# Patient Record
Sex: Female | Born: 1995 | Race: Black or African American | Hispanic: No | Marital: Single | Smoking: Never smoker
Health system: Southern US, Community
[De-identification: ages and names within clinical notes are randomized; demographics above are authoritative.]

## PROBLEM LIST (undated history)

## (undated) DIAGNOSIS — N946 Dysmenorrhea, unspecified: Secondary | ICD-10-CM

## (undated) DIAGNOSIS — R102 Pelvic and perineal pain unspecified side: Secondary | ICD-10-CM

## (undated) DIAGNOSIS — Z862 Personal history of diseases of the blood and blood-forming organs and certain disorders involving the immune mechanism: Secondary | ICD-10-CM

## (undated) DIAGNOSIS — N920 Excessive and frequent menstruation with regular cycle: Secondary | ICD-10-CM

## (undated) DIAGNOSIS — D649 Anemia, unspecified: Secondary | ICD-10-CM

## (undated) DIAGNOSIS — K219 Gastro-esophageal reflux disease without esophagitis: Secondary | ICD-10-CM

## (undated) DIAGNOSIS — N926 Irregular menstruation, unspecified: Secondary | ICD-10-CM

## (undated) DIAGNOSIS — J45909 Unspecified asthma, uncomplicated: Secondary | ICD-10-CM

## (undated) HISTORY — DX: Gastro-esophageal reflux disease without esophagitis: K21.9

## (undated) HISTORY — DX: Excessive and frequent menstruation with regular cycle: N92.0

## (undated) HISTORY — DX: Dysmenorrhea, unspecified: N94.6

## (undated) HISTORY — PX: TONSILLECTOMY: SUR1361

## (undated) HISTORY — PX: POLYPECTOMY: SHX149

## (undated) HISTORY — DX: Personal history of diseases of the blood and blood-forming organs and certain disorders involving the immune mechanism: Z86.2

## (undated) HISTORY — DX: Irregular menstruation, unspecified: N92.6

## (undated) HISTORY — DX: Pelvic and perineal pain: R10.2

## (undated) HISTORY — DX: Pelvic and perineal pain unspecified side: R10.20

---
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
## 2013-07-24 ENCOUNTER — Ambulatory Visit
Admission: RE | Admit: 2013-07-24 | Discharge: 2013-07-24 | Disposition: A | Discharge status: Home / Self Care | Payer: 59 | Source: Ambulatory Visit | Attending: Pediatrics | Admitting: Pediatrics

## 2013-07-24 ENCOUNTER — Other Ambulatory Visit: Payer: Self-pay | Admitting: Pediatrics

## 2013-07-24 DIAGNOSIS — R52 Pain, unspecified: Secondary | ICD-10-CM

## 2016-06-21 ENCOUNTER — Other Ambulatory Visit: Payer: Self-pay | Admitting: Surgery

## 2016-06-21 ENCOUNTER — Ambulatory Visit
Admission: RE | Admit: 2016-06-21 | Discharge: 2016-06-21 | Disposition: A | Discharge status: Home / Self Care | Payer: BLUE CROSS/BLUE SHIELD | Source: Ambulatory Visit | Attending: Surgery | Admitting: Surgery

## 2016-06-21 DIAGNOSIS — R590 Localized enlarged lymph nodes: Secondary | ICD-10-CM

## 2016-06-22 ENCOUNTER — Other Ambulatory Visit: Payer: Self-pay | Admitting: Surgery

## 2016-06-22 DIAGNOSIS — R1909 Other intra-abdominal and pelvic swelling, mass and lump: Secondary | ICD-10-CM | POA: Insufficient documentation

## 2016-06-22 NOTE — Progress Notes (Signed)
Quick Note:  I talked to the patient's father. I have also ordered a CT scan to evaluate this area more closely. ______

## 2016-06-24 ENCOUNTER — Other Ambulatory Visit: Payer: Self-pay | Admitting: Surgery

## 2016-06-24 DIAGNOSIS — R590 Localized enlarged lymph nodes: Secondary | ICD-10-CM

## 2016-06-25 ENCOUNTER — Ambulatory Visit
Admission: RE | Admit: 2016-06-25 | Discharge: 2016-06-25 | Disposition: A | Discharge status: Home / Self Care | Payer: BLUE CROSS/BLUE SHIELD | Source: Ambulatory Visit | Attending: Surgery | Admitting: Surgery

## 2016-06-25 DIAGNOSIS — R1909 Other intra-abdominal and pelvic swelling, mass and lump: Secondary | ICD-10-CM

## 2016-06-25 MED ORDER — IOPAMIDOL (ISOVUE-300) INJECTION 61%
100.0000 mL | Freq: Once | INTRAVENOUS | Status: AC | PRN
  Administered 2016-06-25: 100 mL via INTRAVENOUS

## 2016-06-28 ENCOUNTER — Ambulatory Visit: Payer: Self-pay | Admitting: Surgery

## 2016-07-05 ENCOUNTER — Encounter (HOSPITAL_COMMUNITY): Payer: Self-pay

## 2016-07-05 ENCOUNTER — Encounter (HOSPITAL_COMMUNITY)
Admission: RE | Admit: 2016-07-05 | Discharge: 2016-07-05 | Disposition: A | Discharge status: Home / Self Care | Payer: BLUE CROSS/BLUE SHIELD | Source: Ambulatory Visit | Attending: Surgery | Admitting: Surgery

## 2016-07-05 DIAGNOSIS — K219 Gastro-esophageal reflux disease without esophagitis: Secondary | ICD-10-CM | POA: Diagnosis not present

## 2016-07-05 DIAGNOSIS — K4190 Unilateral femoral hernia, without obstruction or gangrene, not specified as recurrent: Secondary | ICD-10-CM | POA: Diagnosis not present

## 2016-07-05 DIAGNOSIS — J45909 Unspecified asthma, uncomplicated: Secondary | ICD-10-CM | POA: Diagnosis not present

## 2016-07-05 HISTORY — DX: Unspecified asthma, uncomplicated: J45.909

## 2016-07-05 HISTORY — DX: Anemia, unspecified: D64.9

## 2016-07-05 LAB — CBC
HEMATOCRIT: 39 % (ref 36.0–46.0)
HEMOGLOBIN: 12.4 g/dL (ref 12.0–15.0)
MCH: 26 pg (ref 26.0–34.0)
MCHC: 31.8 g/dL (ref 30.0–36.0)
MCV: 81.8 fL (ref 78.0–100.0)
Platelets: 273 10*3/uL (ref 150–400)
RBC: 4.77 MIL/uL (ref 3.87–5.11)
RDW: 13.7 % (ref 11.5–15.5)
WBC: 5.5 10*3/uL (ref 4.0–10.5)

## 2016-07-05 LAB — HCG, SERUM, QUALITATIVE: Preg, Serum: NEGATIVE

## 2016-07-05 NOTE — Pre-Procedure Instructions (Addendum)
Tara Hale  07/05/2016      [facility] - [place], [place] - [address] [place] [address] Phone: [phone] Fax: [phone]  [id number] [place], [place] - [place] [address] [place] [address] Phone: [phone] Fax: [phone]    Your procedure is scheduled on 07/07/16.  Report to [facility] Admitting at 630 A.M.  Call this number if you have problems the morning of surgery:  [phone]   Remember:  Do not eat food or drink liquids after midnight.  Take these medicines the morning of surgery with A SIP OF WATER  STOP all herbel meds, nsaids (aleve,naproxen,advil,ibuprofen) 5 days prior to surgery including all vitamins, aspirin   Do not wear jewelry, make-up or nail polish.  Do not wear lotions, powders, or perfumes.  You may wear deoderant.  Do not shave 48 hours prior to surgery.  Men may shave face and neck.  Do not bring valuables to the hospital.  [facility] is not responsible for any belongings or valuables.  Contacts, dentures or bridgework may not be worn into surgery.  Leave your suitcase in the car.  After surgery it may be brought to your room.  For patients admitted to the hospital, discharge time will be determined by your treatment team.  Patients discharged the day of surgery will not be allowed to drive home.   Name and phone number of your driver:   Special instructions:   Special Instructions: [facility] - Preparing for Surgery  Before surgery, you can play an important role.  Because skin is not sterile, your skin needs to be as free of germs as possible.  You can reduce the number of germs on you skin by washing with CHG (chlorahexidine gluconate) soap before surgery.  CHG is an antiseptic cleaner which kills germs and bonds with the skin to continue killing germs even after washing.  Please DO NOT use if you have an allergy to CHG or antibacterial  soaps.  If your skin becomes reddened/irritated stop using the CHG and inform your nurse when you arrive at Short Stay.  Do not shave (including legs and underarms) for at least 48 hours prior to the first CHG shower.  You may shave your face.  Please follow these instructions carefully:   1.  Shower with CHG Soap the night before surgery and the morning of Surgery.  2.  If you choose to wash your hair, wash your hair first as usual with your normal shampoo.  3.  After you shampoo, rinse your hair and body thoroughly to remove the Shampoo.  4.  Use CHG as you would any other liquid soap.  You can apply chg directly  to the skin and wash gently with scrungie or a clean washcloth.  5.  Apply the CHG Soap to your body ONLY FROM THE NECK DOWN.  Do not use on open wounds or open sores.  Avoid contact with your eyes ears, mouth and genitals (private parts).  Wash genitals (private parts)       with your normal soap.  6.  Wash thoroughly, paying special attention to the area where your surgery will be performed.  7.  Thoroughly rinse your body with warm water from the neck down.  8.  DO NOT shower/wash with your normal soap after using and rinsing off the CHG Soap.  9.  Pat yourself dry with a clean  towel.            10.  Wear clean pajamas.            11.  Place clean sheets on your bed the night of your first shower and do not sleep with pets.  Day of Surgery  Do not apply any lotions/deodorants the morning of surgery.  Please wear clean clothes to the hospital/surgery center.

## 2016-07-06 MED ORDER — CEFAZOLIN SODIUM-DEXTROSE 2-4 GM/100ML-% IV SOLN
2.0000 g | INTRAVENOUS | Status: AC
  Administered 2016-07-07: 2 g via INTRAVENOUS
  Filled 2016-07-06: qty 100

## 2016-07-07 ENCOUNTER — Ambulatory Visit (HOSPITAL_COMMUNITY): Payer: BLUE CROSS/BLUE SHIELD | Admitting: Certified Registered"

## 2016-07-07 ENCOUNTER — Encounter (HOSPITAL_COMMUNITY)
Admission: RE | Disposition: A | Discharge status: Home / Self Care | Payer: Self-pay | Source: Ambulatory Visit | Attending: Surgery

## 2016-07-07 ENCOUNTER — Ambulatory Visit (HOSPITAL_COMMUNITY)
Admission: RE | Admit: 2016-07-07 | Discharge: 2016-07-07 | Disposition: A | Discharge status: Home / Self Care | Payer: BLUE CROSS/BLUE SHIELD | Source: Ambulatory Visit | Attending: Surgery | Admitting: Surgery

## 2016-07-07 ENCOUNTER — Encounter (HOSPITAL_COMMUNITY): Payer: Self-pay | Admitting: Certified Registered"

## 2016-07-07 DIAGNOSIS — K4190 Unilateral femoral hernia, without obstruction or gangrene, not specified as recurrent: Secondary | ICD-10-CM | POA: Insufficient documentation

## 2016-07-07 DIAGNOSIS — K219 Gastro-esophageal reflux disease without esophagitis: Secondary | ICD-10-CM | POA: Insufficient documentation

## 2016-07-07 DIAGNOSIS — J45909 Unspecified asthma, uncomplicated: Secondary | ICD-10-CM | POA: Insufficient documentation

## 2016-07-07 HISTORY — PX: FEMORAL HERNIA REPAIR: SHX632

## 2016-07-07 HISTORY — PX: INSERTION OF MESH: SHX5868

## 2016-07-07 SURGERY — HERNIA REPAIR FEMORAL
Anesthesia: General | Laterality: Left

## 2016-07-07 MED ORDER — LACTATED RINGERS IV SOLN
INTRAVENOUS | Status: DC | PRN
  Administered 2016-07-07: 08:00:00 via INTRAVENOUS

## 2016-07-07 MED ORDER — MORPHINE SULFATE (PF) 2 MG/ML IV SOLN: 2.0000 mg | INTRAVENOUS | Status: DC | PRN

## 2016-07-07 MED ORDER — FENTANYL CITRATE (PF) 250 MCG/5ML IJ SOLN
INTRAMUSCULAR | Status: AC
  Filled 2016-07-07: qty 5

## 2016-07-07 MED ORDER — GLYCOPYRROLATE 0.2 MG/ML IJ SOLN
INTRAMUSCULAR | Status: DC | PRN
  Administered 2016-07-07: 0.3 mg via INTRAVENOUS

## 2016-07-07 MED ORDER — ESMOLOL HCL 100 MG/10ML IV SOLN
INTRAVENOUS | Status: AC
  Filled 2016-07-07: qty 10

## 2016-07-07 MED ORDER — NEOSTIGMINE METHYLSULFATE 10 MG/10ML IV SOLN
INTRAVENOUS | Status: DC | PRN
  Administered 2016-07-07: 3 mg via INTRAVENOUS

## 2016-07-07 MED ORDER — ONDANSETRON HCL 4 MG/2ML IJ SOLN: 4.0000 mg | INTRAMUSCULAR | Status: DC | PRN

## 2016-07-07 MED ORDER — HYDROCODONE-ACETAMINOPHEN 5-325 MG PO TABS: 1.0000 | ORAL_TABLET | ORAL | 0 refills | Status: AC | PRN

## 2016-07-07 MED ORDER — PROPOFOL 10 MG/ML IV BOLUS
INTRAVENOUS | Status: AC
  Filled 2016-07-07: qty 20

## 2016-07-07 MED ORDER — BUPIVACAINE-EPINEPHRINE (PF) 0.25% -1:200000 IJ SOLN
INTRAMUSCULAR | Status: AC
  Filled 2016-07-07: qty 30

## 2016-07-07 MED ORDER — DEXAMETHASONE SODIUM PHOSPHATE 10 MG/ML IJ SOLN
INTRAMUSCULAR | Status: AC
  Filled 2016-07-07: qty 1

## 2016-07-07 MED ORDER — MIDAZOLAM HCL 2 MG/2ML IJ SOLN
INTRAMUSCULAR | Status: AC
  Filled 2016-07-07: qty 2

## 2016-07-07 MED ORDER — ESMOLOL HCL 100 MG/10ML IV SOLN
INTRAVENOUS | Status: DC | PRN
  Administered 2016-07-07: 10 mg via INTRAVENOUS

## 2016-07-07 MED ORDER — LIDOCAINE HCL (CARDIAC) 20 MG/ML IV SOLN
INTRAVENOUS | Status: DC | PRN
  Administered 2016-07-07: 80 mg via INTRAVENOUS

## 2016-07-07 MED ORDER — ROCURONIUM BROMIDE 100 MG/10ML IV SOLN
INTRAVENOUS | Status: DC | PRN
  Administered 2016-07-07: 35 mg via INTRAVENOUS

## 2016-07-07 MED ORDER — DIPHENHYDRAMINE HCL 50 MG/ML IJ SOLN
INTRAMUSCULAR | Status: DC | PRN
  Administered 2016-07-07: 12.5 mg via INTRAVENOUS

## 2016-07-07 MED ORDER — BUPIVACAINE-EPINEPHRINE 0.25% -1:200000 IJ SOLN
INTRAMUSCULAR | Status: DC | PRN
  Administered 2016-07-07: 10 mL

## 2016-07-07 MED ORDER — NEOSTIGMINE METHYLSULFATE 5 MG/5ML IV SOSY
PREFILLED_SYRINGE | INTRAVENOUS | Status: AC
  Filled 2016-07-07: qty 5

## 2016-07-07 MED ORDER — MIDAZOLAM HCL 5 MG/5ML IJ SOLN
INTRAMUSCULAR | Status: DC | PRN
  Administered 2016-07-07: 2 mg via INTRAVENOUS

## 2016-07-07 MED ORDER — 0.9 % SODIUM CHLORIDE (POUR BTL) OPTIME
TOPICAL | Status: DC | PRN
  Administered 2016-07-07: 1000 mL

## 2016-07-07 MED ORDER — FENTANYL CITRATE (PF) 250 MCG/5ML IJ SOLN
INTRAMUSCULAR | Status: DC | PRN
  Administered 2016-07-07: 100 ug via INTRAVENOUS

## 2016-07-07 MED ORDER — DIPHENHYDRAMINE HCL 50 MG/ML IJ SOLN
INTRAMUSCULAR | Status: AC
  Filled 2016-07-07: qty 1

## 2016-07-07 MED ORDER — CHLORHEXIDINE GLUCONATE CLOTH 2 % EX PADS: 6.0000 | MEDICATED_PAD | Freq: Once | CUTANEOUS | Status: DC

## 2016-07-07 MED ORDER — DEXAMETHASONE SODIUM PHOSPHATE 10 MG/ML IJ SOLN
INTRAMUSCULAR | Status: DC | PRN
  Administered 2016-07-07: 10 mg via INTRAVENOUS

## 2016-07-07 MED ORDER — CHLORHEXIDINE GLUCONATE CLOTH 2 % EX PADS
6.0000 | MEDICATED_PAD | Freq: Once | CUTANEOUS | Status: DC
Start: 2016-07-07 — End: 2016-07-07

## 2016-07-07 MED ORDER — PROPOFOL 10 MG/ML IV BOLUS
INTRAVENOUS | Status: DC | PRN
  Administered 2016-07-07: 140 mg via INTRAVENOUS
  Administered 2016-07-07: 20 mg via INTRAVENOUS

## 2016-07-07 MED ORDER — HYDROCODONE-ACETAMINOPHEN 5-325 MG PO TABS
1.0000 | ORAL_TABLET | ORAL | 0 refills | Status: AC | PRN
Start: 2016-07-07 — End: ?

## 2016-07-07 MED ORDER — ONDANSETRON HCL 4 MG/2ML IJ SOLN
INTRAMUSCULAR | Status: DC | PRN
  Administered 2016-07-07: 4 mg via INTRAVENOUS

## 2016-07-07 MED ORDER — GLYCOPYRROLATE 0.2 MG/ML IV SOSY
PREFILLED_SYRINGE | INTRAVENOUS | Status: AC
  Filled 2016-07-07: qty 3

## 2016-07-07 MED ORDER — ONDANSETRON HCL 4 MG/2ML IJ SOLN
INTRAMUSCULAR | Status: AC
  Filled 2016-07-07: qty 2

## 2016-07-07 MED ORDER — HYDROCODONE-ACETAMINOPHEN 5-325 MG PO TABS: 1.0000 | ORAL_TABLET | ORAL | Status: DC | PRN

## 2016-07-07 MED ORDER — LIDOCAINE 2% (20 MG/ML) 5 ML SYRINGE
INTRAMUSCULAR | Status: AC
  Filled 2016-07-07: qty 5

## 2016-07-07 SURGICAL SUPPLY — 47 items
APL SKNCLS STERI-STRIP NONHPOA (GAUZE/BANDAGES/DRESSINGS) ×1
BENZOIN TINCTURE PRP APPL 2/3 (GAUZE/BANDAGES/DRESSINGS) ×3 IMPLANT
BLADE SURG 15 STRL LF DISP TIS (BLADE) ×1 IMPLANT
BLADE SURG 15 STRL SS (BLADE) ×3
BLADE SURG ROTATE 9660 (MISCELLANEOUS) ×2 IMPLANT
CHLORAPREP W/TINT 26ML (MISCELLANEOUS) ×3 IMPLANT
CLOSURE WOUND 1/2 X4 (GAUZE/BANDAGES/DRESSINGS) ×1
COVER SURGICAL LIGHT HANDLE (MISCELLANEOUS) ×3 IMPLANT
DRAIN PENROSE 1/2X12 LTX STRL (WOUND CARE) IMPLANT
DRAPE LAPAROSCOPIC ABDOMINAL (DRAPES) IMPLANT
DRAPE LAPAROTOMY TRNSV 102X78 (DRAPE) ×2 IMPLANT
DRAPE UTILITY XL STRL (DRAPES) ×3 IMPLANT
DRSG TEGADERM 4X4.75 (GAUZE/BANDAGES/DRESSINGS) ×3 IMPLANT
ELECT CAUTERY BLADE 6.4 (BLADE) ×3 IMPLANT
ELECT REM PT RETURN 9FT ADLT (ELECTROSURGICAL) ×3
ELECTRODE REM PT RTRN 9FT ADLT (ELECTROSURGICAL) ×1 IMPLANT
GAUZE SPONGE 4X4 12PLY STRL (GAUZE/BANDAGES/DRESSINGS) ×3 IMPLANT
GAUZE SPONGE 4X4 16PLY XRAY LF (GAUZE/BANDAGES/DRESSINGS) ×3 IMPLANT
GLOVE BIO SURGEON STRL SZ7 (GLOVE) ×3 IMPLANT
GLOVE BIOGEL PI IND STRL 7.5 (GLOVE) ×1 IMPLANT
GLOVE BIOGEL PI INDICATOR 7.5 (GLOVE) ×2
GOWN STRL REUS W/ TWL LRG LVL3 (GOWN DISPOSABLE) ×2 IMPLANT
GOWN STRL REUS W/TWL LRG LVL3 (GOWN DISPOSABLE) ×6
KIT BASIN OR (CUSTOM PROCEDURE TRAY) ×3 IMPLANT
KIT ROOM TURNOVER OR (KITS) ×3 IMPLANT
MESH ULTRAPRO 3X6 7.6X15CM (Mesh General) ×2 IMPLANT
NDL HYPO 25GX1X1/2 BEV (NEEDLE) ×1 IMPLANT
NEEDLE HYPO 25GX1X1/2 BEV (NEEDLE) ×3 IMPLANT
NS IRRIG 1000ML POUR BTL (IV SOLUTION) ×3 IMPLANT
PACK SURGICAL SETUP 50X90 (CUSTOM PROCEDURE TRAY) ×3 IMPLANT
PAD ARMBOARD 7.5X6 YLW CONV (MISCELLANEOUS) ×3 IMPLANT
PENCIL BUTTON HOLSTER BLD 10FT (ELECTRODE) ×3 IMPLANT
SPONGE INTESTINAL PEANUT (DISPOSABLE) ×3 IMPLANT
STRIP CLOSURE SKIN 1/2X4 (GAUZE/BANDAGES/DRESSINGS) ×2 IMPLANT
SUT MNCRL AB 4-0 PS2 18 (SUTURE) ×3 IMPLANT
SUT PDS AB 0 CT 36 (SUTURE) IMPLANT
SUT SILK 2 0 SH (SUTURE) IMPLANT
SUT SILK 3 0 (SUTURE)
SUT SILK 3-0 18XBRD TIE 12 (SUTURE) IMPLANT
SUT VIC AB 0 CT2 27 (SUTURE) ×5 IMPLANT
SUT VIC AB 2-0 SH 27 (SUTURE) ×3
SUT VIC AB 2-0 SH 27X BRD (SUTURE) ×1 IMPLANT
SUT VIC AB 3-0 SH 27 (SUTURE) ×3
SUT VIC AB 3-0 SH 27XBRD (SUTURE) ×1 IMPLANT
SYR CONTROL 10ML LL (SYRINGE) ×3 IMPLANT
TOWEL OR 17X24 6PK STRL BLUE (TOWEL DISPOSABLE) ×3 IMPLANT
TOWEL OR 17X26 10 PK STRL BLUE (TOWEL DISPOSABLE) ×3 IMPLANT

## 2016-07-07 NOTE — Transfer of Care (Signed)
Immediate Anesthesia Transfer of Care Note  Patient: Tara Hale  Procedure(s) Performed: Procedure(s): LEFT FEMORAL HERNIA REPAIR WITH MESH (Left) INSERTION OF MESH (Left)  Patient Location: PACU  Anesthesia Type:General  Level of Consciousness: awake  Airway & Oxygen Therapy: Patient Spontanous Breathing and Patient connected to nasal cannula oxygen  Post-op Assessment: Report given to RN  Post vital signs: Reviewed and stable  Last Vitals:  Vitals:   07/07/16 0707  BP: 130/75  Pulse: 88  Resp: 20  Temp: 37.1 C    Last Pain:  Vitals:   07/07/16 0707  TempSrc: Oral         Complications: No apparent anesthesia complications

## 2016-07-07 NOTE — Anesthesia Postprocedure Evaluation (Signed)
Anesthesia Post Note  Patient: Tara Hale  Procedure(s) Performed: Procedure(s) (LRB): LEFT FEMORAL HERNIA REPAIR WITH MESH (Left) INSERTION OF MESH (Left)  Patient location during evaluation: PACU Level of consciousness: awake Pain management: pain level controlled Respiratory status: spontaneous breathing Cardiovascular status: stable Anesthetic complications: no    Last Vitals:  Vitals:   07/07/16 1125 07/07/16 1135  BP:  110/66  Pulse:  77  Resp:  18  Temp: 36.8 C     Last Pain:  Vitals:   07/07/16 0707  TempSrc: Oral                 EDWARDS,Bayleigh Loflin

## 2016-07-07 NOTE — Anesthesia Preprocedure Evaluation (Addendum)
Anesthesia Evaluation  Patient identified by MRN, date of birth, ID band Patient awake    Reviewed: Allergy & Precautions, NPO status , Patient's Chart, lab work & pertinent test results  Airway Mallampati: II       Dental  (+) Teeth Intact, Dental Advisory Given   Pulmonary asthma ,    breath sounds clear to auscultation       Cardiovascular negative cardio ROS   Rhythm:Regular Rate:Normal     Neuro/Psych    GI/Hepatic GERD  ,History noted. CE   Endo/Other  negative endocrine ROS  Renal/GU negative Renal ROS     Musculoskeletal   Abdominal   Peds  Hematology   Anesthesia Other Findings   Reproductive/Obstetrics                           Anesthesia Physical Anesthesia Plan  ASA: I  Anesthesia Plan: General   Post-op Pain Management:    Induction: Intravenous  Airway Management Planned: Oral ETT  Additional Equipment:   Intra-op Plan:   Post-operative Plan: Extubation in OR  Informed Consent: I have reviewed the patients History and Physical, chart, labs and discussed the procedure including the risks, benefits and alternatives for the proposed anesthesia with the patient or authorized representative who has indicated his/her understanding and acceptance.     Plan Discussed with: CRNA and Anesthesiologist  Anesthesia Plan Comments:         Anesthesia Quick Evaluation

## 2016-07-07 NOTE — Op Note (Signed)
Left femoral hernia repair with mesh  Indications: The patient presented with a history of a left, not reducible inguinal hernia.  This was seen on CT scan.  Pre-operative Diagnosis: left not reducible inguinal hernia Post-operative Diagnosis: Left femoral hernia  Surgeon: Fred Hammes K.   Assistants: none  Anesthesia: General LMA anesthesia  ASA Class: 1  Procedure Details  The patient was seen again in the Holding Room. The risks, benefits, complications, treatment options, and expected outcomes were discussed with the patient. The possibilities of reaction to medication, pulmonary aspiration, perforation of viscus, bleeding, recurrent infection, the need for additional procedures, and development of a complication requiring transfusion or further operation were discussed with the patient and/or family. The likelihood of success in repairing the hernia and returning the patient to their previous functional status is good.  There was concurrence with the proposed plan, and informed consent was obtained. The site of surgery was properly noted/marked. The patient was taken to the Operating Room, identified as Tara Hale, and the procedure verified as left inguinal hernia repair. A Time Out was held and the above information confirmed.  The patient was placed in the supine position and underwent induction of anesthesia. The lower abdomen and groin was prepped with Chloraprep and draped in the standard fashion, and 0.25% Marcaine with epinephrine was used to anesthetize the skin over the mid-portion of the inguinal canal. An oblique incision was made. Dissection was carried down through the subcutaneous tissue with cautery to the external oblique fascia.  We opened the external oblique fascia along the direction of its fibers to the external ring.  The round ligament was circumferentially dissected, ligated with 3-0 silk, and divided. The floor of the inguinal canal was inspected and was lax,  but no direct defect.  The internal ring was closed with 0 Vicryl  We used an Ultrapro mesh which was cut to fit, inserted and deployed across the floor of the inguinal canal. The mesh was tucked underneath the external oblique fascia laterally.  The mesh was secured to the pubic tubercle with 0 Vicryl.  We ran the suture along the shelving edge inferiorly and the internal oblique fascia superiorly.  We then explored the remainder of the groin.  There seemed to be some fullness below the left inguinal ligament.  I dissected around this area and identified a moderate sized femoral hernia sac.  We were able to reduce this through a 9 mm defect.  We created a small roll of Ultrapro mesh and inserted this into the femoral defect.  This mesh was secured with 0 Vicryl.  The external oblique fascia was reapproximated with 2-0 Vicryl.  3-0 Vicryl was used to close the subcutaneous tissues and 4-0 Monocryl was used to close the skin in subcuticular fashion.  Benzoin and steri-strips were used to seal the incision.  A clean dressing was applied.  The patient was then extubated and brought to the recovery room in stable condition.  All sponge, instrument, and needle counts were correct prior to closure and at the conclusion of the case.   Estimated Blood Loss: Minimal                 Complications: None; patient tolerated the procedure well.         Disposition: PACU - hemodynamically stable.         Condition: stable  Wilmon Arms. Corliss Skains, MD, [facility] Surgery  General/ Trauma Surgery  07/07/2016 9:53 AM

## 2016-07-07 NOTE — H&P (Signed)
History of Present Illness Tara Hale. Tara Regner MD; 06/21/2016 2:59 PM) Patient words: recheck L groin.  The patient is a 20 year old female who presents with a complaint of Mass. This is a 20 year old female in good health who presents with a 6-week history of a palpable mass in her left groin. The patient was in the shower and ran her hand over this area. She felt some discomfort and a small mass. This mass has not enlarged since she first noticed it. There remains mildly tender. She denies any recent infection or trauma to that extremity. She denies any systemic symptoms of weight loss, night sweats, or easy bruising. She has not had any imaging of this area. The mass does not reduce when she is supine.  I examined her a few weeks ago and started her on an empiric course of Keflex. There has not been any significant change. This area remains mildly tender. It has not enlarged. It has not decreased in size. She denies any significant change in her overall health.  Further radiologic work-up has revealed a small fluid-containing inguinal hernia in the left groin.  She presents now for hernia repair.  CLINICAL DATA:  Left groin mass.  EXAM: CT PELVIS WITH CONTRAST  TECHNIQUE: Multidetector CT imaging of the pelvis was performed using the standard protocol following the bolus administration of intravenous contrast.  CONTRAST:  ISOVUE-300 IOPAMIDOL (ISOVUE-300) INJECTION 61%  COMPARISON:  None.  FINDINGS: Vitamin-E capsule was placed on the skin to identify the area of patient concern. Just deep to the capsule, a 1.5 x 1.2 x 3.3 cm fluid collection is identified. There is appears to represent fluid in a left groin hernia through a very tiny defect in the underlying fascia. The neck of the hernia is fairly well demonstrated on axial image 29 of series 2. No evidence for bowel protrusion through the defect of the fascia. Although the hernia sac is fluid filled,  there is not a substantial amount of edema or inflammation in this region.  A trace amount of free fluid is identified in the cul-de-sac uterus is unremarkable. There is no adnexal mass. Visualized large bowel and colon of the pelvis is unremarkable. The appendix is fluid-filled and upper normal for diameter at 7 mm.  Bone windows reveal no worrisome lytic or sclerotic osseous lesions.  IMPRESSION: The palpable left groin abnormality represents a fluid-filled hernia sack protruding through what appears to be a relatively tiny defect in the fascia. There is no associated bowel herniation. No substantial edema or inflammation associated with the hernia sac.   Electronically Signed   By: Kennith Center M.D.   On: 06/25/2016 17:24   CLINICAL DATA:  Left inguinal lymphadenopathy.  EXAM: LIMITED ULTRASOUND OF PELVIS  TECHNIQUE: Limited transabdominal ultrasound examination of the pelvis was performed.  COMPARISON:  None.  FINDINGS: There is an elongated hypo to anechoic mass in the left inguinal region corresponding to the palpable abnormality. It measures 2.9 x 1.2 x 1.3 cm. No blood flow seen within this. This appears to be a fluid collection.  IMPRESSION: 1. 2.9 cm elongated apparent fluid collection in the left inguinal region. This could reflect a lymph node with necrosis. Alternatively, this may reflect herniation of peritoneum and containing fluid fluid inguinal hernia. Consider follow-up with contrast-enhanced CT.   Electronically Signed   By: Amie Portland M.D.   On: 06/21/2016 16:30    Other Problems Fay Records, CMA; 06/01/2016 10:04 AM) Asthma Gastroesophageal Reflux Disease  Past  Surgical History Fay Records, CMA; 06/01/2016 10:04 AM) Tonsillectomy  Diagnostic Studies History Fay Records, [place]; 06/01/2016 10:04 AM) Colonoscopy never Mammogram never Pap Smear 1-5 years ago  Allergies Fay Records, CMA; 06/01/2016 10:04 AM) Soybean  (Diagnostic) *DIAGNOSTIC PRODUCTS* Pollen Extracts *ALTERNATIVE MEDICINES*  Medication History Fay Records, CMA; 06/01/2016 10:05 AM) Minastrin 24 Fe (1-20MG -MCG(24) Tablet Chewable, Oral) Active. Medications Reconciled  Social History Fay Records, [place]; 06/01/2016 10:04 AM) Alcohol use Occasional alcohol use. Caffeine use Carbonated beverages, Tea. No drug use Tobacco use Never smoker.  Family History Fay Records, [place]; 06/01/2016 10:04 AM) Arthritis Mother. Family history unknown First Degree Relatives  Pregnancy / Birth History Fay Records, CMA; 06/01/2016 10:04 AM) Age at menarche 9 years. Contraceptive History Oral contraceptives. Gravida 0 Para 0 Regular periods    Review of Systems Fay Records CMA; 06/01/2016 10:04 AM) General Not Present- Appetite Loss, Chills, Fatigue, Fever, Night Sweats, Weight Gain and Weight Loss. Skin Not Present- Change in Wart/Mole, Dryness, Hives, Jaundice, New Lesions, Non-Healing Wounds, Rash and Ulcer. HEENT Present- Seasonal Allergies and Wears glasses/contact lenses. Not Present- Earache, Hearing Loss, Hoarseness, Nose Bleed, Oral Ulcers, Ringing in the Ears, Sinus Pain, Sore Throat, Visual Disturbances and Yellow Eyes. Breast Not Present- Breast Mass, Breast Pain, Nipple Discharge and Skin Changes. Cardiovascular Present- Leg Cramps. Not Present- Chest Pain, Difficulty Breathing Lying Down, Palpitations, Rapid Heart Rate, Shortness of Breath and Swelling of Extremities. Gastrointestinal Not Present- Abdominal Pain, Bloating, Bloody Stool, Change in Bowel Habits, Chronic diarrhea, Constipation, Difficulty Swallowing, Excessive gas, Gets full quickly at meals, Hemorrhoids, Indigestion, Nausea, Rectal Pain and Vomiting. Female Genitourinary Not Present- Frequency, Nocturia, Painful Urination, Pelvic Pain and Urgency. Musculoskeletal Not Present- Back Pain, Joint Pain, Joint Stiffness, Muscle Pain, Muscle Weakness and Swelling of  Extremities. Neurological Present- Decreased Memory. Not Present- Fainting, Headaches, Numbness, Seizures, Tingling, Tremor, Trouble walking and Weakness. Psychiatric Not Present- Anxiety, Bipolar, Change in Sleep Pattern, Depression, Fearful and Frequent crying. Endocrine Not Present- Cold Intolerance, Excessive Hunger, Hair Changes, Heat Intolerance, Hot flashes and New Diabetes. Hematology Not Present- Easy Bruising, Excessive bleeding, Gland problems, HIV and Persistent Infections. Allergies (Ammie Eversole, LPN; 3/79/4327 6:14 AM) Soybean (Diagnostic) *DIAGNOSTIC PRODUCTS* Pollen Extracts *ALTERNATIVE MEDICINES*   Vitals (Ammie Eversole LPN; 06/20/2956 4:73 AM) 06/21/2016 9:05 AM Weight: 123.4 lb Height: 66in Body Surface Area: 1.63 m Body Mass Index: 19.92 kg/m  Temp.: 98.104F(Oral)  Pulse: 72 (Regular)  BP: 124/70 (Sitting, Left Arm, Standard)       Physical Exam Molli Hazard K. Amariyon Maynes MD; 06/21/2016 3:00 PM) The physical exam findings are as follows: Note:WDWN in NAD HEENT: EOMI, sclera anicteric Bilateral axillae - no sign of axillary lymphadenopathy Bilateral groins - no sign of inguinal hernia; 1.5 cm palpable left inguinal mass with some associated tenderness; minimal palpable lymph nodes in the right groin; no sign of erythema or inflammation Ext: Well-perfused; no edema; no sign of injury or infection to the left lower extremity Skin: Warm, dry; no sign of jaundice    Assessment & Plan  Left inguinal hernia  Plan:  Left inguinal hernia repair with mesh.  The surgical procedure has been discussed with the patient.  Potential risks, benefits, alternative treatments, and expected outcomes have been explained.  All of the patient's questions at this time have been answered.  The likelihood of reaching the patient's treatment goal is good.  The patient understand the proposed surgical procedure and wishes to proceed.   Tara Hale. Corliss Skains, MD, [facility] Surgery  General/ Trauma Surgery  07/07/2016 7:29 AM

## 2016-07-07 NOTE — Discharge Instructions (Signed)
[facility], [place]  HERNIA REPAIR: POST OP INSTRUCTIONS  Always review your discharge instruction sheet given to you by the facility where your surgery was performed. IF YOU HAVE DISABILITY OR FAMILY LEAVE FORMS, YOU MUST BRING THEM TO THE OFFICE FOR PROCESSING.   DO NOT GIVE THEM TO YOUR DOCTOR.  1. A  prescription for pain medication may be given to you upon discharge.  Take your pain medication as prescribed, if needed.  If narcotic pain medicine is not needed, then you may take acetaminophen (Tylenol) or ibuprofen (Advil) as needed. 2. Take your usually prescribed medications unless otherwise directed. 3. If you need a refill on your pain medication, please contact your pharmacy.  They will contact our office to request authorization. Prescriptions will not be filled after 5 pm or on week-ends. 4. You should follow a light diet the first 24 hours after arrival home, such as soup and crackers, etc.  Be sure to include lots of fluids daily.  Resume your normal diet the day after surgery. 5. Most patients will experience some swelling and bruising in the groin.  Ice packs and reclining will help.  Swelling and bruising can take several days to resolve.  6. It is common to experience some constipation if taking pain medication after surgery.  Increasing fluid intake and taking a stool softener (such as Colace) will usually help or prevent this problem from occurring.  A mild laxative (Milk of Magnesia or Miralax) should be taken according to package directions if there are no bowel movements after 48 hours. 7. Unless discharge instructions indicate otherwise, you may remove your bandages 24-48 hours after surgery, and you may shower at that time.  You will have steri-strips (small skin tapes) in place directly over the incision.  These strips should be left on the skin for 7-10 days. 8. ACTIVITIES:  You may resume regular (light) daily activities beginning the next day--such as daily  self-care, walking, climbing stairs--gradually increasing activities as tolerated.  You may have sexual intercourse when it is comfortable.  Refrain from any heavy lifting or straining until approved by your doctor. a. You may drive when you are no longer taking prescription pain medication, you can comfortably wear a seatbelt, and you can safely maneuver your car and apply brakes. b. RETURN TO WORK:  2-3 weeks with light duty - no lifting over 15 lbs. 9. You should see your doctor in the office for a follow-up appointment approximately 2-3 weeks after your surgery.  Make sure that you call for this appointment within a day or two after you arrive home to insure a convenient appointment time. 10. OTHER INSTRUCTIONS:  __________________________________________________________________________________________________________________________________________________________________________________________  WHEN TO CALL YOUR DOCTOR: 1. Fever over 101.0 2. Inability to urinate 3. Nausea and/or vomiting 4. Extreme swelling or bruising 5. Continued bleeding from incision. 6. Increased pain, redness, or drainage from the incision  The clinic staff is available to answer your questions during regular business hours.  Please dont hesitate to call and ask to speak to one of the nurses for clinical concerns.  If you have a medical emergency, go to the nearest emergency room or call 911.  A surgeon from [facility] is always on call at the hospital   [address], [place], [place]  [address] ?  [address] [address], [place], [place]   [address] [phone]    FAX [phone] Web site: [email or web address]

## 2016-07-07 NOTE — Anesthesia Procedure Notes (Signed)
Procedure Name: Intubation Date/Time: 07/07/2016 8:34 AM Performed by: De Nurse Pre-anesthesia Checklist: Patient identified, Emergency Drugs available, Suction available, Patient being monitored and Timeout performed Patient Re-evaluated:Patient Re-evaluated prior to inductionOxygen Delivery Method: Circle system utilized Preoxygenation: Pre-oxygenation with 100% oxygen Intubation Type: IV induction Ventilation: Mask ventilation without difficulty Laryngoscope Size: Mac and 3 Grade View: Grade I Tube type: Oral Tube size: 7.0 mm Number of attempts: 1 Airway Equipment and Method: Stylet Placement Confirmation: ETT inserted through vocal cords under direct vision,  positive ETCO2 and breath sounds checked- equal and bilateral Secured at: 21 cm Tube secured with: Tape Dental Injury: Teeth and Oropharynx as per pre-operative assessment

## 2016-07-08 ENCOUNTER — Encounter (HOSPITAL_COMMUNITY): Payer: Self-pay | Admitting: Surgery

## 2016-10-22 IMAGING — CT CT PELVIS W/ CM
2 of 3 series · 16 of 46 positions shown, 18 images · IV contrast (APPLIED)
Comparison: None.

CLINICAL DATA: Left groin mass.

EXAM:
CT PELVIS WITH CONTRAST
TECHNIQUE: Multidetector CT imaging of the pelvis was performed using the
standard protocol following the bolus administration of intravenous
contrast.
CONTRAST:  100mL SHWKOR-E88 IOPAMIDOL (SHWKOR-E88) INJECTION 61%

[Series 2: routine pelvis w/cm · axial · 0.60mm/px · z∈[+122,+327]mm · 13 of 47 slices shown, 15 images]
[im 3/47  soft-tissue]
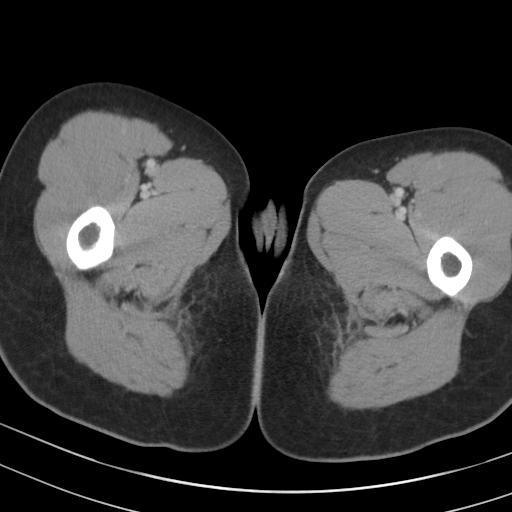
[im 3/47  bone]
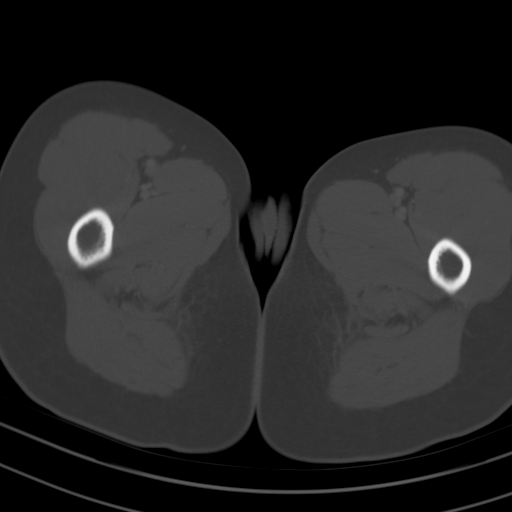
[im 6/47  soft-tissue]
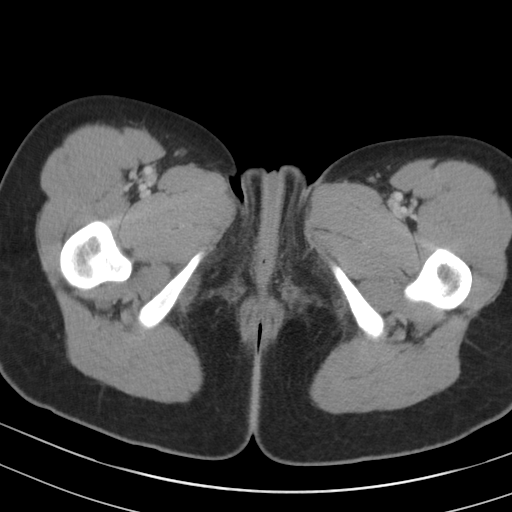
[im 9/47  soft-tissue]
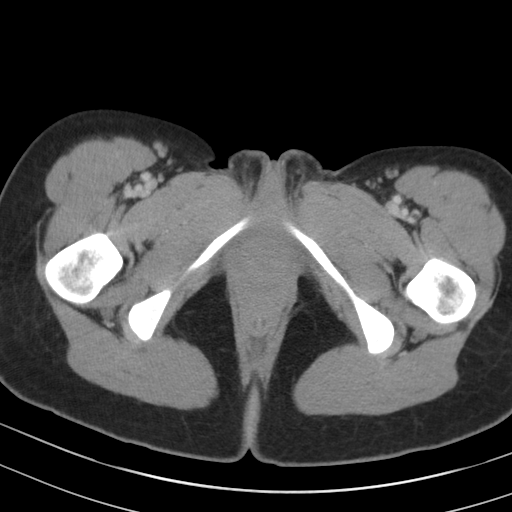
[im 14/47  soft-tissue]
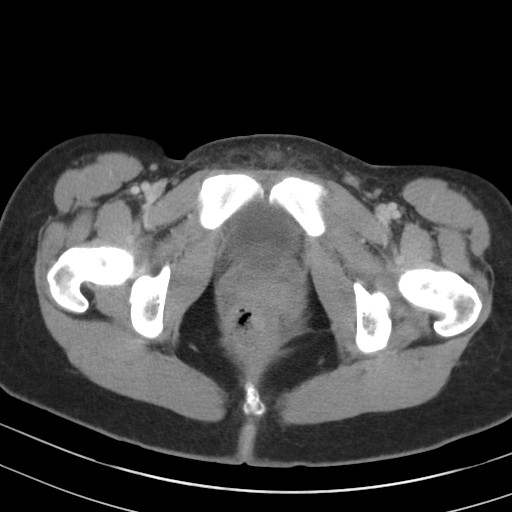
[im 17/47  soft-tissue]
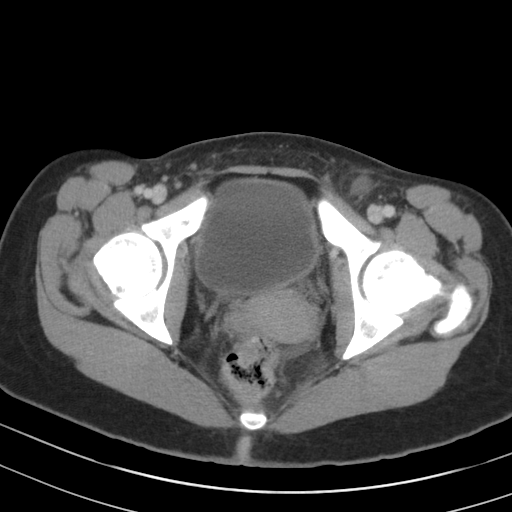
[im 20/47  soft-tissue]
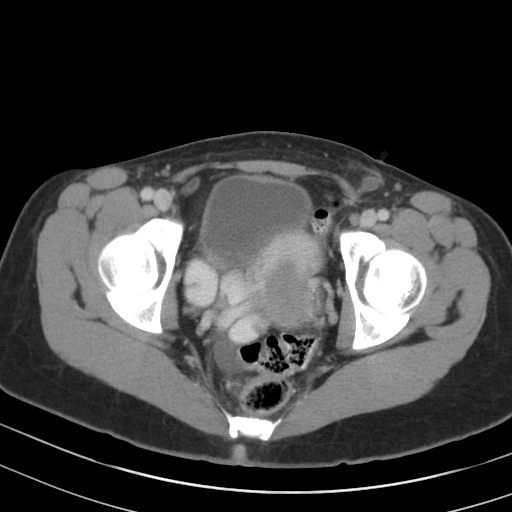
[im 24/47  soft-tissue]
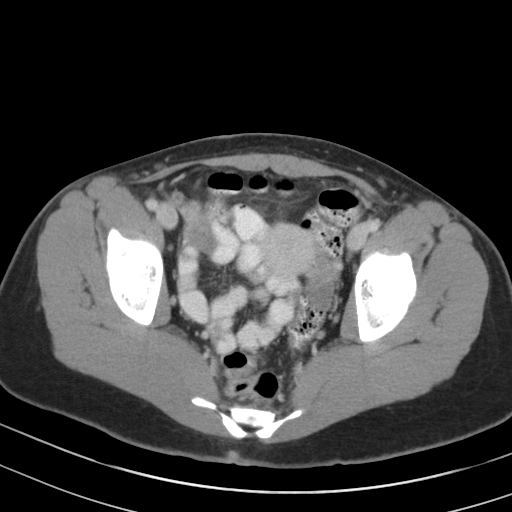
[im 27/47  soft-tissue]
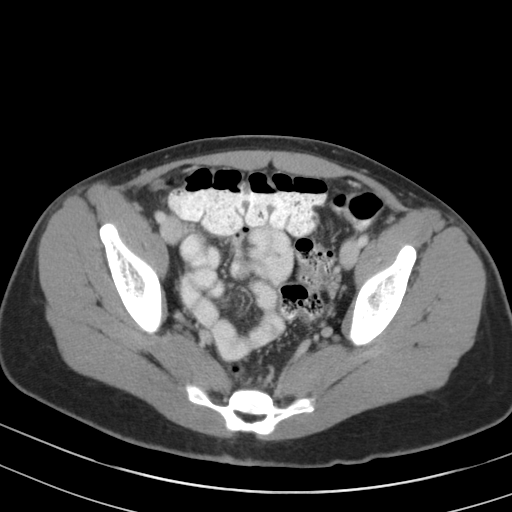
[im 30/47  soft-tissue]
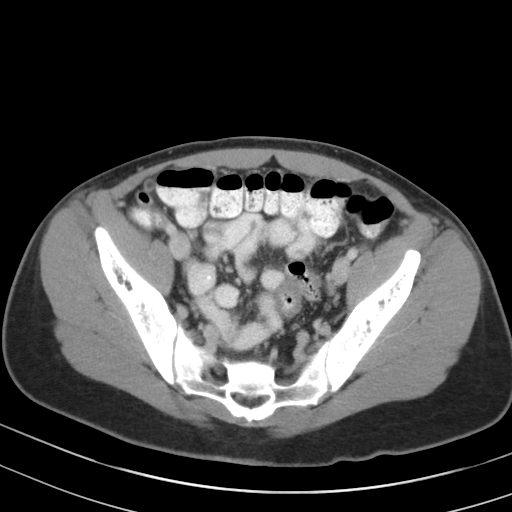
[im 30/47  bone]
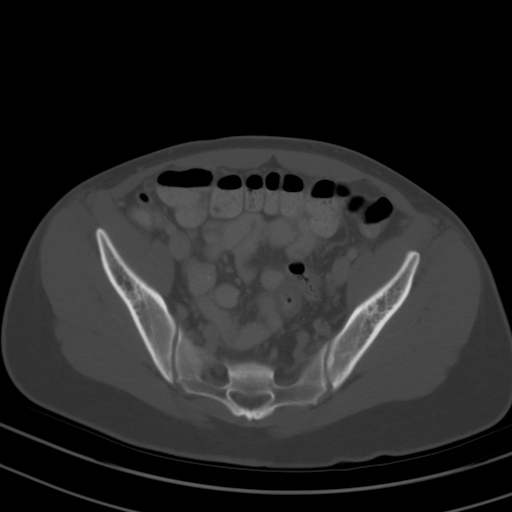
[im 33/47  soft-tissue]
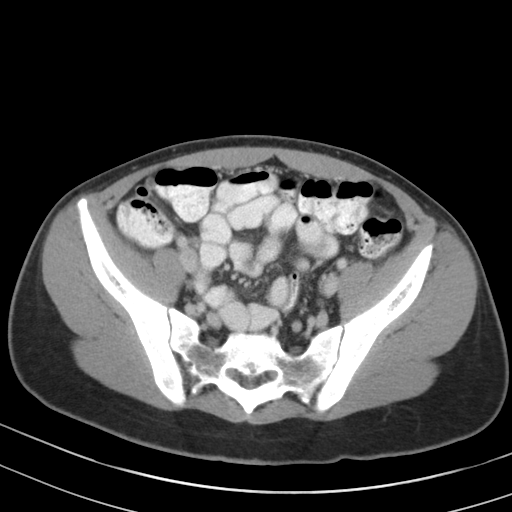
[im 38/47  soft-tissue]
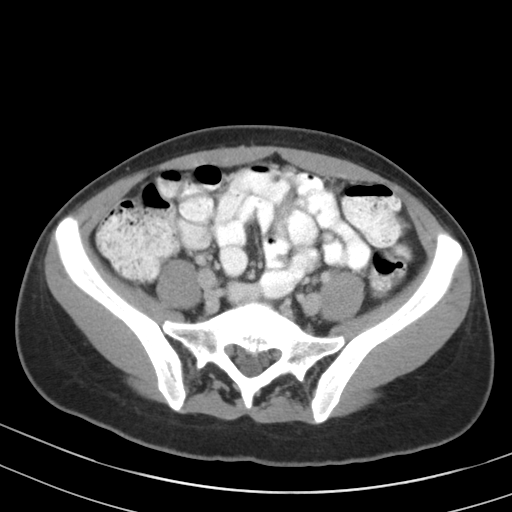
[im 41/47  soft-tissue]
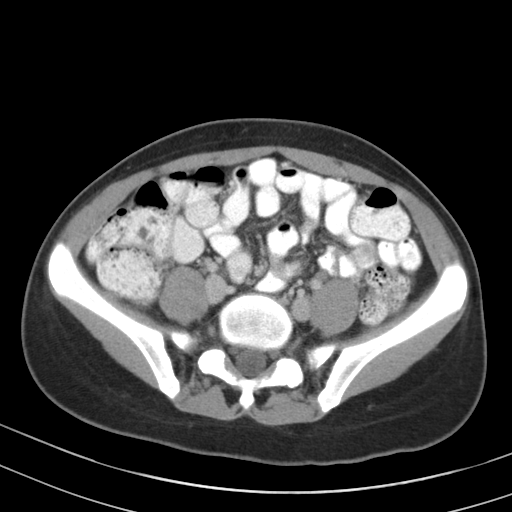
[im 44/47  soft-tissue]
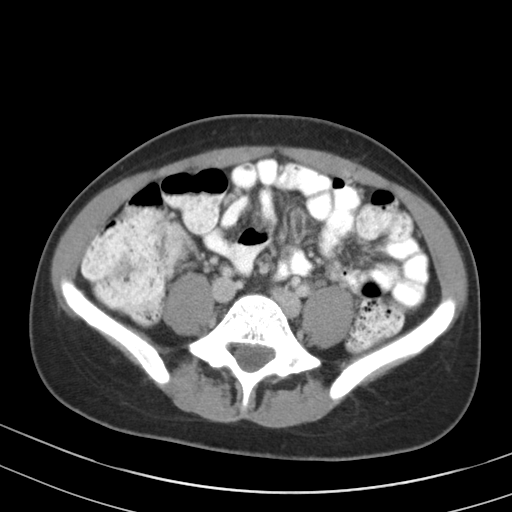

[Series 4: cor · coronal · 0.47mm/px · 3 of 69 slices shown]
[im 23/69  soft-tissue]
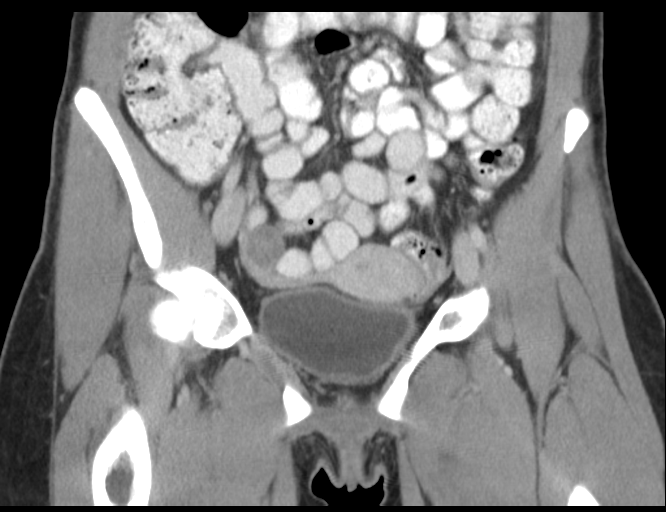
[im 31/69  soft-tissue]
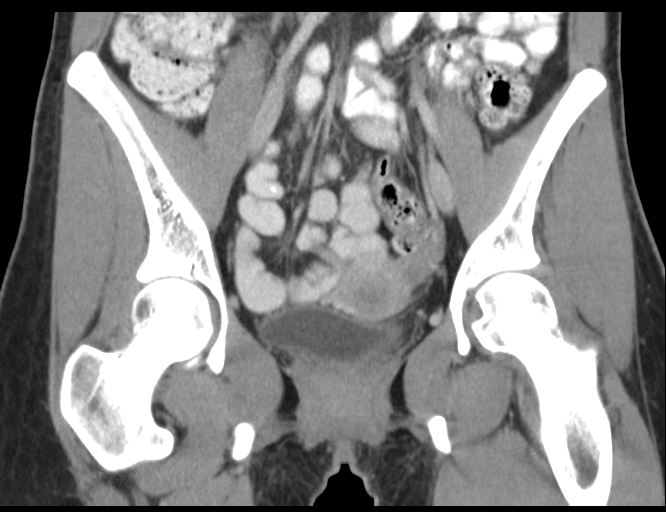
[im 38/69  soft-tissue]
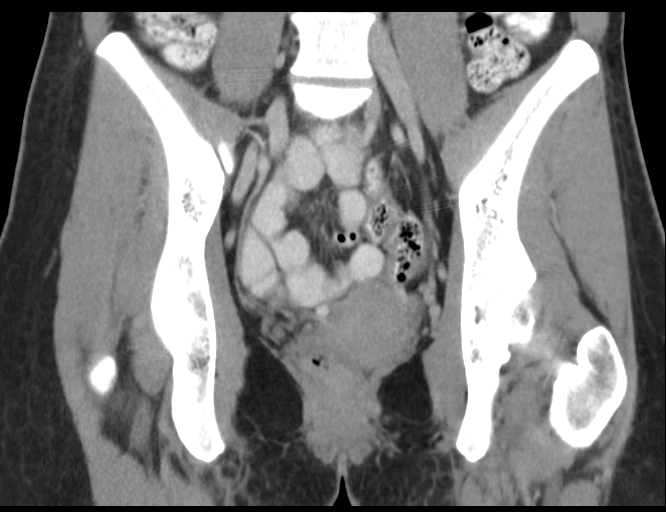

[16 of 46 positions shown; findings below may reference images not displayed]

FINDINGS: Vitamin-E capsule was placed on the skin to identify the area of
patient concern. Just deep to the capsule, a 1.5 x 1.2 x 3.3 cm
fluid collection is identified. There is appears to represent fluid
in a left groin hernia through a very tiny defect in the underlying
fascia. The neck of the hernia is fairly well demonstrated on axial
image 29 of series 2. No evidence for bowel protrusion through the
defect of the fascia. Although the hernia sac is fluid filled, there
is not a substantial amount of edema or inflammation in this region.

A trace amount of free fluid is identified in the cul-de-sac uterus
is unremarkable. There is no adnexal mass. Visualized large bowel
and colon of the pelvis is unremarkable. The appendix is
fluid-filled and upper normal for diameter at 7 mm.

Bone windows reveal no worrisome lytic or sclerotic osseous lesions.
IMPRESSION: The palpable left groin abnormality represents a fluid-filled hernia
sack protruding through what appears to be a relatively tiny defect
in the fascia. There is no associated bowel herniation. No
substantial edema or inflammation associated with the hernia sac.
